# Patient Record
Sex: Male | Born: 1995 | Race: White | Hispanic: No
Health system: Southern US, Community
[De-identification: ages and names within clinical notes are randomized; demographics above are authoritative.]

## PROBLEM LIST (undated history)

## (undated) DIAGNOSIS — Z789 Other specified health status: Secondary | ICD-10-CM

## (undated) HISTORY — PX: WISDOM TOOTH EXTRACTION: SHX21

## (undated) HISTORY — PX: TONSILLECTOMY: SUR1361

## (undated) HISTORY — PX: OTHER SURGICAL HISTORY: SHX169

## (undated) HISTORY — DX: Other specified health status: Z78.9

---
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
## 2016-04-18 ENCOUNTER — Encounter: Payer: Self-pay | Admitting: Family Medicine

## 2016-04-18 ENCOUNTER — Ambulatory Visit (INDEPENDENT_AMBULATORY_CARE_PROVIDER_SITE_OTHER): Payer: 59 | Admitting: Family Medicine

## 2016-04-18 VITALS — BP 134/65 | HR 60 | Temp 98.2°F | Ht 75.0 in | Wt 194.0 lb

## 2016-04-18 DIAGNOSIS — Z114 Encounter for screening for human immunodeficiency virus [HIV]: Secondary | ICD-10-CM

## 2016-04-18 DIAGNOSIS — Z0000 Encounter for general adult medical examination without abnormal findings: Secondary | ICD-10-CM

## 2016-04-18 DIAGNOSIS — R7401 Elevation of levels of liver transaminase levels: Secondary | ICD-10-CM

## 2016-04-18 DIAGNOSIS — R740 Nonspecific elevation of levels of transaminase and lactic acid dehydrogenase [LDH]: Secondary | ICD-10-CM

## 2016-04-18 LAB — COMPREHENSIVE METABOLIC PANEL
ALBUMIN: 5 g/dL (ref 3.5–5.2)
ALT: 73 U/L — AB (ref 0–53)
AST: 255 U/L — AB (ref 0–37)
Alkaline Phosphatase: 54 U/L (ref 39–117)
BILIRUBIN TOTAL: 0.5 mg/dL (ref 0.2–1.2)
BUN: 14 mg/dL (ref 6–23)
CALCIUM: 10.1 mg/dL (ref 8.4–10.5)
CO2: 31 mEq/L (ref 19–32)
CREATININE: 0.95 mg/dL (ref 0.40–1.50)
Chloride: 102 mEq/L (ref 96–112)
GFR: 104.96 mL/min (ref >60.00)
Glucose, Bld: 109 mg/dL — ABNORMAL HIGH (ref 70–99)
Potassium: 3.6 mEq/L (ref 3.5–5.1)
Sodium: 140 mEq/L (ref 135–145)
Total Protein: 7.6 g/dL (ref 6.0–8.3)

## 2016-04-18 LAB — LIPID PANEL
CHOL/HDL RATIO: 3
CHOLESTEROL: 187 mg/dL (ref 0–200)
HDL: 69.2 mg/dL (ref >39.00)
LDL Cholesterol: 109 mg/dL — ABNORMAL HIGH (ref 0–99)
NonHDL: 117.83
TRIGLYCERIDES: 42 mg/dL (ref 0.0–149.0)
VLDL: 8.4 mg/dL (ref 0.0–40.0)

## 2016-04-18 LAB — HIV ANTIBODY (ROUTINE TESTING W REFLEX): HIV 1&2 Ab, 4th Generation: NONREACTIVE

## 2016-04-18 NOTE — Patient Instructions (Signed)
Good luck in the future.

## 2016-04-18 NOTE — Progress Notes (Signed)
Chief Complaint  Patient presents with  . Establish Care    No acute concerns noted.    Well Male Trevor Conley is here for a complete physical.   His last physical was >1 year ago.  Current diet: in general, a "healthy" diet   Current exercise: lifting weights, doing cardio Weight trend: stable Does pt snore? No. Daytime fatigue? No. Seat belt? Yes.    Past Medical History:  Diagnosis Date  . No known health problems     Past Surgical History:  Procedure Laterality Date  . Thumb Surgery    . TONSILLECTOMY    . WISDOM TOOTH EXTRACTION     Medications  Takes no medications routinely.   Allergies No Known Allergies   Family History Family History  Problem Relation Age of Onset  . Diabetes Maternal Grandfather     Review of Systems: Constitutional:  no unexpected change in weight, no fevers or chills Eye:  no recent significant change in vision Ear/Nose/Mouth/Throat:  Ears:  no tinnitus or hearing loss Nose/Mouth/Throat:  no complaints of nasal congestion or bleeding, no sore throat and oral sores Cardiovascular:  no chest pain, no palpitations Respiratory:  no cough and no shortness of breath Gastrointestinal:  no abdominal pain, no change in bowel habits, no nausea, vomiting, diarrhea, or constipation and no black or bloody stool GU:  Male: negative for dysuria, frequency, and incontinence and negative for prostate symptoms Musculoskeletal/Extremities:  no pain, redness, or swelling of the joints Integumentary (Skin/Breast):  no abnormal skin lesions reported Neurologic:  no headaches, no numbness, tingling Endocrine:  weight changes, masses in the neck, heat/cold intolerance, bowel or skin changes, or cardiovascular system symptoms Hematologic/Lymphatic:  no abnormal bleeding, no HIV risk factors, no night sweats, no swollen nodes, no weight loss  Exam BP 134/65 (BP Location: Right Arm, Patient Position: Sitting, Cuff Size: Normal)   Pulse 60   Temp 98.2 F  (36.8 C) (Oral)   Ht 6\' 3"  (1.905 m)   Wt 194 lb (88 kg)   SpO2 100% Comment: RA  BMI 24.25 kg/m  General:  well developed, well nourished, in no apparent distress Skin:  no significant moles, warts, or growths Head:  no masses, lesions, or tenderness Eyes:  pupils equal and round, sclera anicteric without injection Ears:  canals without lesions, TMs shiny without retraction, no obvious effusion, no erythema Nose:  nares patent, septum midline, mucosa normal Throat/Pharynx:  lips and gingiva without lesion; tongue and uvula midline; non-inflamed pharynx; no exudates or postnasal drainage Neck: neck supple without adenopathy, thyromegaly, or masses Lungs:  clear to auscultation, breath sounds equal bilaterally, no respiratory distress Cardio:  regular rate and rhythm without murmurs, heart sounds without clicks or rubs Abdomen:  abdomen soft, nontender; bowel sounds normal; no masses or organomegaly Genital (male): circumcised penis, no lesions or discharge; testes present bilaterally without masses or tenderness Rectal: Deferred Musculoskeletal:  symmetrical muscle groups noted without atrophy or deformity Extremities:  no clubbing, cyanosis, or edema, no deformities, no skin discoloration Neuro:  gait normal; deep tendon reflexes normal and symmetric Psych: well oriented with normal range of affect and appropriate judgment/insight  Assessment and Plan  Well adult exam - Plan: Comprehensive metabolic panel, Lipid panel  Encounter for screening for HIV - Plan: HIV antibody   Well [age] y.o. male. Counseled on diet and exercise. He is doing an excellent job. He got a job in [place] that he will attend after he graduates this spring.  Immunizations, labs, and  further orders as above. F/u pending the above workup. I will likely never see him again unless he has an acute issue as he is moving away. The patient voiced understanding and agreement to the plan.  Jilda Roche [place],  DO 04/18/16 9:07 AM

## 2016-04-18 NOTE — Progress Notes (Signed)
Pre visit review using our clinic review tool, if applicable. No additional management support is needed unless otherwise documented below in the visit note. 

## 2016-04-21 ENCOUNTER — Other Ambulatory Visit: Payer: Self-pay | Admitting: Family Medicine

## 2016-04-21 DIAGNOSIS — R7401 Elevation of levels of liver transaminase levels: Secondary | ICD-10-CM

## 2016-04-21 DIAGNOSIS — R740 Nonspecific elevation of levels of transaminase and lactic acid dehydrogenase [LDH]: Principal | ICD-10-CM

## 2016-04-21 NOTE — Addendum Note (Signed)
Addended by: Kandace BlitzLONG, Desteny Freeman M on: 04/21/2016 02:05 PM   Modules accepted: Orders

## 2017-04-30 ENCOUNTER — Encounter: Payer: 59 | Admitting: Family Medicine

## 2017-05-08 ENCOUNTER — Encounter: Payer: Self-pay | Admitting: Family Medicine

## 2017-05-08 ENCOUNTER — Other Ambulatory Visit (HOSPITAL_COMMUNITY)
Admission: RE | Admit: 2017-05-08 | Discharge: 2017-05-08 | Disposition: A | Discharge status: Home / Self Care | Payer: 59 | Source: Ambulatory Visit | Attending: Family Medicine | Admitting: Family Medicine

## 2017-05-08 ENCOUNTER — Ambulatory Visit (INDEPENDENT_AMBULATORY_CARE_PROVIDER_SITE_OTHER): Payer: 59 | Admitting: Family Medicine

## 2017-05-08 VITALS — BP 122/68 | HR 62 | Temp 98.1°F | Ht 74.0 in | Wt 192.0 lb

## 2017-05-08 DIAGNOSIS — Z23 Encounter for immunization: Secondary | ICD-10-CM | POA: Diagnosis not present

## 2017-05-08 DIAGNOSIS — R5383 Other fatigue: Secondary | ICD-10-CM | POA: Insufficient documentation

## 2017-05-08 DIAGNOSIS — Z0000 Encounter for general adult medical examination without abnormal findings: Secondary | ICD-10-CM | POA: Insufficient documentation

## 2017-05-08 DIAGNOSIS — Z113 Encounter for screening for infections with a predominantly sexual mode of transmission: Secondary | ICD-10-CM | POA: Diagnosis not present

## 2017-05-08 LAB — LIPID PANEL
CHOL/HDL RATIO: 2
CHOLESTEROL: 176 mg/dL (ref 0–200)
HDL: 80.9 mg/dL (ref >39.00)
LDL CALC: 86 mg/dL (ref 0–99)
NonHDL: 94.84
TRIGLYCERIDES: 42 mg/dL (ref 0.0–149.0)
VLDL: 8.4 mg/dL (ref 0.0–40.0)

## 2017-05-08 LAB — COMPREHENSIVE METABOLIC PANEL
ALBUMIN: 4.8 g/dL (ref 3.5–5.2)
ALT: 21 U/L (ref 0–53)
AST: 26 U/L (ref 0–37)
Alkaline Phosphatase: 52 U/L (ref 39–117)
BUN: 17 mg/dL (ref 6–23)
CALCIUM: 9.8 mg/dL (ref 8.4–10.5)
CHLORIDE: 102 meq/L (ref 96–112)
CO2: 28 meq/L (ref 19–32)
CREATININE: 1.1 mg/dL (ref 0.40–1.50)
GFR: 87.8 mL/min (ref >60.00)
Glucose, Bld: 108 mg/dL — ABNORMAL HIGH (ref 70–99)
POTASSIUM: 3.9 meq/L (ref 3.5–5.1)
Sodium: 141 mEq/L (ref 135–145)
Total Bilirubin: 1 mg/dL (ref 0.2–1.2)
Total Protein: 7.3 g/dL (ref 6.0–8.3)

## 2017-05-08 LAB — TSH: TSH: 2.06 u[IU]/mL (ref 0.35–4.50)

## 2017-05-08 NOTE — Progress Notes (Signed)
Pre visit review using our clinic review tool, if applicable. No additional management support is needed unless otherwise documented below in the visit note. 

## 2017-05-08 NOTE — Addendum Note (Signed)
Addended by: Scharlene GlossEWING, Karron Goens B on: 05/08/2017 09:35 AM   Modules accepted: Orders

## 2017-05-08 NOTE — Patient Instructions (Addendum)
Keep up the good work.   We should be able to get the results of your labs to you by the middle of next week.   Let us know if you need anything.

## 2017-05-08 NOTE — Progress Notes (Signed)
Chief Complaint  Patient presents with  . Annual Exam    Well Male Trevor Conley is here for a complete physical.   His last physical was >1 year ago.  Current diet: in general, a "healthy" diet   Current exercise: Lifting 3x/week, cardio 1-2 times Weight trend: stable Does pt snore? No. Daytime fatigue? Slight fatigue, would like thyroid checked.  Seat belt? Yes.    Health maintenance Tetanus- No  Past Medical History:  Diagnosis Date  . No known health problems     Past Surgical History:  Procedure Laterality Date  . Thumb Surgery    . TONSILLECTOMY    . WISDOM TOOTH EXTRACTION     Medications  Takes no meds routinely.  Allergies No Known Allergies   Family History Family History  Problem Relation Age of Onset  . Diabetes Maternal Grandfather     Review of Systems: Constitutional: no fevers or chills Eye:  no recent significant change in vision Ear/Nose/Mouth/Throat:  Ears:  no tinnitus or hearing loss Nose/Mouth/Throat:  no complaints of nasal congestion or bleeding, no sore throat Cardiovascular:  no chest pain, no palpitations Respiratory:  no cough and no shortness of breath Gastrointestinal:  no abdominal pain, no change in bowel habits, no nausea, vomiting, diarrhea, or constipation and no black or bloody stool GU:  Male: negative for dysuria, frequency, and incontinence and negative for prostate symptoms Musculoskeletal/Extremities:  no pain, redness, or swelling of the joints Integumentary (Skin/Breast):  no abnormal skin lesions reported Neurologic:  no headaches, no numbness, tingling Endocrine: No unexpected weight changes Hematologic/Lymphatic:  no night sweats  Exam BP 122/68 (BP Location: Left Arm, Patient Position: Sitting, Cuff Size: Normal)   Pulse 62   Temp 98.1 F (36.7 C) (Oral)   Ht 6\' 2"  (1.88 m)   Wt 192 lb (87.1 kg)   SpO2 97%   BMI 24.65 kg/m  General:  well developed, well nourished, in no apparent distress Skin:  no  significant moles, warts, or growths Head:  no masses, lesions, or tenderness Eyes:  pupils equal and round, sclera anicteric without injection Ears:  canals without lesions, TMs shiny without retraction, no obvious effusion, no erythema Nose:  nares patent, septum midline, mucosa normal Throat/Pharynx:  lips and gingiva without lesion; tongue and uvula midline; non-inflamed pharynx; no exudates or postnasal drainage Neck: neck supple without adenopathy, thyromegaly, or masses Lungs:  clear to auscultation, breath sounds equal bilaterally, no respiratory distress Cardio:  regular rate and rhythm without murmurs, heart sounds without clicks or rubs Abdomen:  abdomen soft, nontender; bowel sounds normal; no masses or organomegaly Genital (male): circumcised penis, no lesions or discharge; testes present bilaterally without masses or tenderness Rectal: Deferred Musculoskeletal:  symmetrical muscle groups noted without atrophy or deformity Extremities:  no clubbing, cyanosis, or edema, no deformities, no skin discoloration Neuro:  gait normal; deep tendon reflexes normal and symmetric Psych: well oriented with normal range of affect and appropriate judgment/insight  Assessment and Plan  Well adult exam - Plan: Comprehensive metabolic panel, Lipid panel  Other fatigue - Plan: TSH  Routine screening for STI (sexually transmitted infection) - Plan: Urine cytology ancillary only, HIV antibody (with reflex)   Well 22 y.o. male. Counseled on diet and exercise. Ck STI's at request of pt.  TSH for fatigue.  Other orders as above. Follow up in 1 year pending the above workup. The patient voiced understanding and agreement to the plan.  Jilda Rocheicholas [place], DO 05/08/17 9:29 AM

## 2017-05-09 LAB — HIV ANTIBODY (ROUTINE TESTING W REFLEX): HIV: NONREACTIVE

## 2017-05-11 LAB — URINE CYTOLOGY ANCILLARY ONLY
CHLAMYDIA, DNA PROBE: NEGATIVE
NEISSERIA GONORRHEA: NEGATIVE
Trichomonas: NEGATIVE

## 2017-05-28 ENCOUNTER — Other Ambulatory Visit: Payer: Self-pay

## 2017-05-28 ENCOUNTER — Encounter (HOSPITAL_BASED_OUTPATIENT_CLINIC_OR_DEPARTMENT_OTHER): Payer: Self-pay | Admitting: Emergency Medicine

## 2017-05-28 ENCOUNTER — Emergency Department (HOSPITAL_BASED_OUTPATIENT_CLINIC_OR_DEPARTMENT_OTHER): Payer: 59

## 2017-05-28 ENCOUNTER — Emergency Department (HOSPITAL_BASED_OUTPATIENT_CLINIC_OR_DEPARTMENT_OTHER)
Admission: EM | Admit: 2017-05-28 | Discharge: 2017-05-29 | Disposition: A | Discharge status: Home / Self Care | Payer: 59 | Attending: Emergency Medicine | Admitting: Emergency Medicine

## 2017-05-28 DIAGNOSIS — F430 Acute stress reaction: Secondary | ICD-10-CM | POA: Insufficient documentation

## 2017-05-28 DIAGNOSIS — R079 Chest pain, unspecified: Secondary | ICD-10-CM | POA: Diagnosis present

## 2017-05-28 NOTE — ED Triage Notes (Signed)
Patient states that he has had pain to his chest x a month intermittently. The patient reports that he has some chest tightness with the pain  - Family was concerned because his HR was low at home and his BP was low. PAtient has normal BP

## 2017-05-29 ENCOUNTER — Encounter (HOSPITAL_BASED_OUTPATIENT_CLINIC_OR_DEPARTMENT_OTHER): Payer: Self-pay | Admitting: Emergency Medicine

## 2017-05-29 LAB — BASIC METABOLIC PANEL
ANION GAP: 10 (ref 5–15)
BUN: 23 mg/dL — ABNORMAL HIGH (ref 6–20)
CO2: 24 mmol/L (ref 22–32)
Calcium: 9.5 mg/dL (ref 8.9–10.3)
Chloride: 103 mmol/L (ref 101–111)
Creatinine, Ser: 1.07 mg/dL (ref 0.61–1.24)
GFR calc Af Amer: >60 mL/min (ref >60)
GLUCOSE: 100 mg/dL — AB (ref 65–99)
POTASSIUM: 3.5 mmol/L (ref 3.5–5.1)
Sodium: 137 mmol/L (ref 135–145)

## 2017-05-29 LAB — CBC
HEMATOCRIT: 46 % (ref 39.0–52.0)
HEMOGLOBIN: 16 g/dL (ref 13.0–17.0)
MCH: 31.3 pg (ref 26.0–34.0)
MCHC: 34.8 g/dL (ref 30.0–36.0)
MCV: 90 fL (ref 78.0–100.0)
Platelets: 151 10*3/uL (ref 150–400)
RBC: 5.11 MIL/uL (ref 4.22–5.81)
RDW: 12.1 % (ref 11.5–15.5)
WBC: 7.3 10*3/uL (ref 4.0–10.5)

## 2017-05-29 LAB — TROPONIN I: Troponin I: <0.03 ng/mL (ref <0.03)

## 2017-05-29 NOTE — ED Notes (Signed)
Pt. Reports he has had anxiety lately and aware of his anxiety issues.  RN spoke with Pt and did teaching with Pt. About speaking with someone about his anxiety.  Pt. Parents at bedside.

## 2017-05-29 NOTE — ED Notes (Signed)
Report given to Katie RN.

## 2017-05-29 NOTE — ED Provider Notes (Signed)
[facility] EMERGENCY DEPARTMENT Provider Note   CSN: [id number] Arrival date & time: 05/28/17  2105     History   Chief Complaint Chief Complaint  Patient presents with  . Chest Pain    HPI Trevor Conley is a 22 y.o. male.  The history is provided by the patient. No language interpreter was used.  Chest Pain   This is a recurrent problem. The current episode started 3 to 5 hours ago. The problem occurs constantly. The problem has not changed since onset.The pain is associated with an emotional upset (when under stress, tonight was at a funeral). The pain is present in the substernal region. The pain is moderate. The quality of the pain is described as dull. The pain does not radiate. Exacerbated by: upset. Pertinent negatives include no abdominal pain, no back pain, no claudication, no cough, no diaphoresis, no dizziness, no exertional chest pressure, no fever, no headaches, no hemoptysis, no irregular heartbeat, no leg pain, no lower extremity edema, no malaise/fatigue, no nausea, no near-syncope, no numbness, no orthopnea, no palpitations, no PND, no shortness of breath, no sputum production, no syncope, no vomiting and no weakness. He has tried nothing for the symptoms. The treatment provided no relief. Risk factors include male gender.  Pertinent negatives for past medical history include no valve disorder.  Pertinent negatives for family medical history include: no Marfan's syndrome.  Procedure history is negative for cardiac catheterization.    Past Medical History:  Diagnosis Date  . No known health problems     There are no active problems to display for this patient.   Past Surgical History:  Procedure Laterality Date  . Thumb Surgery    . TONSILLECTOMY    . WISDOM TOOTH EXTRACTION         Home Medications    Prior to Admission medications   Not on File    Family History Family History  Problem Relation Age of Onset  . Diabetes Maternal  Grandfather     Social History Social History   Tobacco Use  . Smoking status: Never Smoker  . Smokeless tobacco: Never Used  Substance Use Topics  . Alcohol use: Yes    Comment: Social  . Drug use: No     Allergies   Patient has no known allergies.   Review of Systems Review of Systems  Constitutional: Negative for diaphoresis, fever and malaise/fatigue.  Respiratory: Negative for cough, hemoptysis, sputum production and shortness of breath.   Cardiovascular: Positive for chest pain. Negative for palpitations, orthopnea, claudication, leg swelling, syncope, PND and near-syncope.  Gastrointestinal: Negative for abdominal pain, nausea and vomiting.  Musculoskeletal: Negative for back pain.  Neurological: Negative for dizziness, weakness, numbness and headaches.  All other systems reviewed and are negative.    Physical Exam Updated Vital Signs BP (!) 106/53   Pulse 63   Temp 98.1 F (36.7 C) (Oral)   Resp 18   Ht 6\' 2"  (1.88 m)   Wt 87.1 kg (192 lb)   SpO2 96%   BMI 24.65 kg/m   Physical Exam  Constitutional: He is oriented to person, place, and time. He appears well-developed and well-nourished. No distress.  HENT:  Head: Normocephalic and atraumatic.  Mouth/Throat: No oropharyngeal exudate.  Eyes: Conjunctivae are normal. Pupils are equal, round, and reactive to light.  Neck: Normal range of motion. Neck supple.  Cardiovascular: Normal rate, regular rhythm, normal heart sounds and intact distal pulses.  Pulmonary/Chest: Effort normal and breath sounds normal.  No stridor. He has no wheezes. He has no rales.  Abdominal: Soft. Bowel sounds are normal. He exhibits no mass. There is no tenderness. There is no rebound and no guarding.  Musculoskeletal: Normal range of motion. He exhibits no edema.  Neurological: He is alert and oriented to person, place, and time. He displays normal reflexes. He exhibits normal muscle tone. Coordination normal.  Skin: Skin is warm  and dry. Capillary refill takes less than 2 seconds.     ED Treatments / Results  Labs (all labs ordered are listed, but only abnormal results are displayed) Results for orders placed or performed during the hospital encounter of 05/28/17  Basic metabolic panel  Result Value Ref Range   Sodium 137 135 - 145 mmol/L   Potassium 3.5 3.5 - 5.1 mmol/L   Chloride 103 101 - 111 mmol/L   CO2 24 22 - 32 mmol/L   Glucose, Bld 100 (H) 65 - 99 mg/dL   BUN 23 (H) 6 - 20 mg/dL   Creatinine, Ser 5.40 0.61 - 1.24 mg/dL   Calcium 9.5 8.9 - 98.1 mg/dL   GFR calc non Af Amer >60 >60 mL/min   GFR calc Af Amer >60 >60 mL/min   Anion gap 10 5 - 15  CBC  Result Value Ref Range   WBC 7.3 4.0 - 10.5 K/uL   RBC 5.11 4.22 - 5.81 MIL/uL   Hemoglobin 16.0 13.0 - 17.0 g/dL   HCT 19.1 47.8 - 29.5 %   MCV 90.0 78.0 - 100.0 fL   MCH 31.3 26.0 - 34.0 pg   MCHC 34.8 30.0 - 36.0 g/dL   RDW 62.1 30.8 - 65.7 %   Platelets 151 150 - 400 K/uL  Troponin I  Result Value Ref Range   Troponin I <0.03 <0.03 ng/mL   Dg Chest 2 View  Result Date: 05/28/2017 CLINICAL DATA:  Chest pain EXAM: CHEST  2 VIEW COMPARISON:  08/04/2010 FINDINGS: The heart size and mediastinal contours are within normal limits. Both lungs are clear. The visualized skeletal structures are unremarkable. IMPRESSION: No active cardiopulmonary disease. Electronically Signed   By: Jasmine Pang M.D.   On: 05/28/2017 22:05    EKG  EKG Interpretation  Date/Time:  Thursday May 28 2017 21:08:15 EST Ventricular Rate:  52 PR Interval:  160 QRS Duration: 96 QT Interval:  392 QTC Calculation: 364 R Axis:   96 Text Interpretation:  Sinus bradycardia with sinus arrhythmia Rightward axis Nonspecific ST abnormality Abnormal ECG No old tracing to compare Confirmed by Rolan Bucco [phone]) on 05/28/2017 11:05:41 PM       Radiology Dg Chest 2 View  Result Date: 05/28/2017 CLINICAL DATA:  Chest pain EXAM: CHEST  2 VIEW COMPARISON:  08/04/2010  FINDINGS: The heart size and mediastinal contours are within normal limits. Both lungs are clear. The visualized skeletal structures are unremarkable. IMPRESSION: No active cardiopulmonary disease. Electronically Signed   By: Jasmine Pang M.D.   On: 05/28/2017 22:05    Procedures Procedures (including critical care time)  PERC negative wells ) highly doubt PE.  I do not believethe is ACS.  This is clearly anxiety HEART score 0 low risk for MACE  Final Clinical Impressions(s) / ED Diagnoses   Final diagnoses:  Stress reaction    Return for weakness, numbness, changes in vision or speech,  fevers > 100.4 unrelieved by medication, shortness of breath, intractable vomiting, or diarrhea, abdominal pain, Inability to tolerate liquids or food, cough, altered mental status or  any concerns. No signs of systemic illness or infection. The patient is nontoxic-appearing on exam and vital signs are within normal limits.    I have reviewed the triage vital signs and the nursing notes. Pertinent labs &imaging results that were available during my care of the patient were reviewed by me and considered in my medical decision making (see chart for details).  After history, exam, and medical workup I feel the patient has been appropriately medically screened and is safe for discharge home. Pertinent diagnoses were discussed with the patient. Patient was given return precautions.      Florina Glas, MD 05/29/17 [phone]

## 2017-06-01 ENCOUNTER — Ambulatory Visit: Payer: 59 | Admitting: Family Medicine

## 2017-06-01 ENCOUNTER — Encounter: Payer: Self-pay | Admitting: Family Medicine

## 2017-06-01 VITALS — BP 118/80 | HR 60 | Temp 98.4°F | Ht 74.0 in | Wt 190.5 lb

## 2017-06-01 DIAGNOSIS — F419 Anxiety disorder, unspecified: Secondary | ICD-10-CM

## 2017-06-01 DIAGNOSIS — F410 Panic disorder [episodic paroxysmal anxiety] without agoraphobia: Secondary | ICD-10-CM | POA: Diagnosis not present

## 2017-06-01 NOTE — Progress Notes (Signed)
Pre visit review using our clinic review tool, if applicable. No additional management support is needed unless otherwise documented below in the visit note. 

## 2017-06-01 NOTE — Patient Instructions (Signed)
Please consider counseling. Contact [phone] to schedule an appointment or inquire about cost/insurance coverage.  Coping skills Choose 5 that work for you:  Take a deep breath  Count to 20  Read a book  Do a puzzle  Meditate  Bake  Sing  Knit  Garden  Pray  Go outside  Call a friend  Listen to music  Take a walk  Color  Send a note  Take a bath  Watch a movie  Be alone in a quiet place  Pet an animal  Visit a friend  Journal  Exercise  Stretch   There are medicines we can start also. Send me a MyChart message if you are interested in starting either a daily medicine (sertraline/Zoloft) or a medicine you can take as needed for panic attacks (hydroxyzine/atarax).

## 2017-06-01 NOTE — Progress Notes (Signed)
Chief Complaint  Patient presents with  . Hospitalization Follow-up    panic attacks    Subjective Trevor Conley is an 22 y.o. male who presents with anxiety/depression Symptoms began 2 mo ago. Anxiety symptoms: difficulty concentrating, fatigue, irritable, palpitations, panic attacks. Family history significant for anxiety and bipolar. Social stressors include  job finding issues. He is not currently, nor has he ever been on medication for this. He is not following with a psychologist.  Past Medical History:  Diagnosis Date  . No known health problems     Review Of Systems Psychiatric: as noted in HPI  Exam BP 118/80 (BP Location: Left Arm, Patient Position: Sitting, Cuff Size: Large)   Pulse 60   Temp 98.4 F (36.9 C) (Oral)   Ht 6\' 2"  (1.88 m)   Wt 190 lb 8 oz (86.4 kg)   SpO2 97%   BMI 24.46 kg/m  General:  well developed, well nourished, in no apparent distress Lungs:  clear to auscultation, breath sounds equal bilaterally, normal respiratory effort without accessory muscle use Cardio:  regular rate and rhythm without murmurs Abdomen:  abdomen soft, nontender; bowel sounds normal; no masses or organomegaly Neuro:  deep tendon reflexes normal and symmetric and no cerebellar signs or ataxia noted Psych: well oriented with normal range of affect and age-appropriate judgement/insight  Assessment and Plan  Anxiety  Panic attacks  Discussed options of medication, counseling, watchful waiting. He would like to see the counseling team.  Send mychart message if interested in medical management. Will f/u in 6 weeks after starting a med.  Counseled on adjunctive treatment with exercise/physical activity. Number for [facility] provided in AVS. F/u prn. Patient voiced understanding and agreement to the plan.  Jilda Rocheicholas Paul [place], DO 06/01/17 12:10 PM

## 2018-09-15 ENCOUNTER — Ambulatory Visit (INDEPENDENT_AMBULATORY_CARE_PROVIDER_SITE_OTHER): Payer: 59 | Admitting: Family Medicine

## 2018-09-15 ENCOUNTER — Encounter: Payer: Self-pay | Admitting: Family Medicine

## 2018-09-15 ENCOUNTER — Telehealth: Payer: Self-pay | Admitting: Family Medicine

## 2018-09-15 DIAGNOSIS — R5383 Other fatigue: Secondary | ICD-10-CM

## 2018-09-15 NOTE — Telephone Encounter (Signed)
Copied from [place] [phone] [phone]. Topic: General - Other >> Sep 15, 2018  9:07 AM Carolyn Stare wrote:  Pt Mom call to req an appt said pt is having some fatigue for the past couple weeks  Called and scheduled Virtual Visit today at 4

## 2018-09-15 NOTE — Progress Notes (Signed)
Chief Complaint  Patient presents with  . Fatigue    for several weeks    Subjective: Patient is a 23 y.o. male here for fatigue. Due to COVID-19 pandemic, we are interacting via web portal for an electronic face-to-face visit. I verified patient's ID using 2 identifiers. Patient agreed to proceed with visit via this method. Patient is at home, I am at office. Patient and I are present for visit.    +fatigue for past 2 weeks. No change in diet or PO intake of water. Has been less active due to quarantine. Feels down more because of quarantine. No wt changes, does not take any medications routinely. Has been snoring over past week, no famhx of OSA. He does not wake up gasping for air. Does not wake up feeling refreshed.   ROS: Const: No wt loss  Past Medical History:  Diagnosis Date  . No known health problems     Objective: No conversational dyspnea Age appropriate judgment and insight Nml affect and mood  Assessment and Plan: Fatigue, unspecified type - Plan: Comprehensive metabolic panel, CBC, TSH, T4, free, Vitamin D (25 hydroxy)  Orders as above. Could be combo of less physical activity and situational depression. Educated pt that OSA is also a distant consideration and we may not come to a satisfactory answer.  The patient voiced understanding and agreement to the plan.  [place], DO 09/15/18  4:28 PM

## 2018-09-21 ENCOUNTER — Other Ambulatory Visit: Payer: Self-pay

## 2018-09-21 ENCOUNTER — Other Ambulatory Visit (INDEPENDENT_AMBULATORY_CARE_PROVIDER_SITE_OTHER): Payer: BC Managed Care – PPO

## 2018-09-21 DIAGNOSIS — R5383 Other fatigue: Secondary | ICD-10-CM

## 2018-09-22 ENCOUNTER — Encounter: Payer: Self-pay | Admitting: Family Medicine

## 2018-09-22 LAB — COMPREHENSIVE METABOLIC PANEL
ALT: 28 U/L (ref 0–53)
AST: 29 U/L (ref 0–37)
Albumin: 5.1 g/dL (ref 3.5–5.2)
Alkaline Phosphatase: 65 U/L (ref 39–117)
BUN: 14 mg/dL (ref 6–23)
CO2: 27 mEq/L (ref 19–32)
Calcium: 9.9 mg/dL (ref 8.4–10.5)
Chloride: 103 mEq/L (ref 96–112)
Creatinine, Ser: 0.91 mg/dL (ref 0.40–1.50)
GFR: 101.64 mL/min (ref >60.00)
Glucose, Bld: 81 mg/dL (ref 70–99)
Potassium: 4.1 mEq/L (ref 3.5–5.1)
Sodium: 141 mEq/L (ref 135–145)
Total Bilirubin: 0.8 mg/dL (ref 0.2–1.2)
Total Protein: 7.2 g/dL (ref 6.0–8.3)

## 2018-09-22 LAB — CBC
HCT: 45.3 % (ref 39.0–52.0)
Hemoglobin: 15.6 g/dL (ref 13.0–17.0)
MCHC: 34.6 g/dL (ref 30.0–36.0)
MCV: 92.5 fl (ref 78.0–100.0)
Platelets: 137 10*3/uL — ABNORMAL LOW (ref 150.0–400.0)
RBC: 4.89 Mil/uL (ref 4.22–5.81)
RDW: 12.5 % (ref 11.5–15.5)
WBC: 7.2 10*3/uL (ref 4.0–10.5)

## 2018-09-22 LAB — TSH: TSH: 2.41 u[IU]/mL (ref 0.35–4.50)

## 2018-09-22 LAB — VITAMIN D 25 HYDROXY (VIT D DEFICIENCY, FRACTURES): VITD: 72.62 ng/mL (ref 30.00–100.00)

## 2018-09-22 LAB — T4, FREE: Free T4: 1.23 ng/dL (ref 0.60–1.60)

## 2019-04-15 ENCOUNTER — Encounter: Payer: Self-pay | Admitting: Family Medicine

## 2019-04-15 ENCOUNTER — Ambulatory Visit (INDEPENDENT_AMBULATORY_CARE_PROVIDER_SITE_OTHER): Payer: BLUE CROSS/BLUE SHIELD | Admitting: Family Medicine

## 2019-04-15 ENCOUNTER — Other Ambulatory Visit: Payer: Self-pay

## 2019-04-15 VITALS — Temp 96.0°F | Ht 74.0 in | Wt 194.0 lb

## 2019-04-15 DIAGNOSIS — U071 COVID-19: Secondary | ICD-10-CM

## 2019-04-15 MED ORDER — PREDNISONE 20 MG PO TABS: 40.0000 mg | ORAL_TABLET | Freq: Every day | ORAL | 0 refills | Status: AC

## 2019-04-15 NOTE — Progress Notes (Signed)
Chief Complaint  Patient presents with  . jointsaches, cold feet/hands, sweating, fatifuge    COVID in November    Subjective: Patient is a 24 y.o. male here for f/u. Due to COVID-19 pandemic, we are interacting via web portal for an electronic face-to-face visit. I verified patient's ID using 2 identifiers. Patient agreed to proceed with visit via this method. Patient is at home, I am at office. Patient and I are present for visit.   Dx'd w COVID in Nov. Still having fatigue, intermittent sob, and achiness. It waxes and wanes but does not seem to be improving. Has tried Tylenol at points which helps temporarily. No cough, wheezing, URI s/s's, fevers, N/V. No new exposure to sick contacts. Tried running and it took him around 1 week to recover.   ROS: Endo: No wt loss Const: No fevers  Past Medical History:  Diagnosis Date  . No known health problems     Objective: Temp (!) 96 F (35.6 C) (Temporal)   Ht 6\' 2"  (1.88 m)   Wt 194 lb (88 kg)   BMI 24.91 kg/m  No conversational dyspnea Age appropriate judgment and insight Nml affect and mood  Assessment and Plan: COVID-19 - Plan: predniSONE (DELTASONE) 20 MG tablet  Push fluids. Yoga/stretching, ease back into workouts. Could try pred burst if these do not work. Heat. Tylenol prn. This could last several more weeks, cont supportive care. Let know if things change. The patient voiced understanding and agreement to the plan.  [place], DO 04/15/19  4:19 PM

## 2019-04-26 ENCOUNTER — Encounter: Payer: Self-pay | Admitting: Family Medicine

## 2019-04-29 ENCOUNTER — Ambulatory Visit (INDEPENDENT_AMBULATORY_CARE_PROVIDER_SITE_OTHER): Payer: BLUE CROSS/BLUE SHIELD | Admitting: Family Medicine

## 2019-04-29 ENCOUNTER — Encounter: Payer: Self-pay | Admitting: Family Medicine

## 2019-04-29 ENCOUNTER — Other Ambulatory Visit: Payer: Self-pay

## 2019-04-29 VITALS — BP 120/80 | HR 73 | Temp 97.7°F | Ht 74.0 in | Wt 194.0 lb

## 2019-04-29 DIAGNOSIS — E559 Vitamin D deficiency, unspecified: Secondary | ICD-10-CM | POA: Diagnosis not present

## 2019-04-29 DIAGNOSIS — U071 COVID-19: Secondary | ICD-10-CM

## 2019-04-29 DIAGNOSIS — R5383 Other fatigue: Secondary | ICD-10-CM

## 2019-04-29 LAB — COMPREHENSIVE METABOLIC PANEL
ALT: 23 U/L (ref 0–53)
AST: 19 U/L (ref 0–37)
Albumin: 5 g/dL (ref 3.5–5.2)
Alkaline Phosphatase: 59 U/L (ref 39–117)
BUN: 13 mg/dL (ref 6–23)
CO2: 28 mEq/L (ref 19–32)
Calcium: 10.1 mg/dL (ref 8.4–10.5)
Chloride: 103 mEq/L (ref 96–112)
Creatinine, Ser: 1.08 mg/dL (ref 0.40–1.50)
GFR: 83.01 mL/min (ref >60.00)
Glucose, Bld: 82 mg/dL (ref 70–99)
Potassium: 4 mEq/L (ref 3.5–5.1)
Sodium: 141 mEq/L (ref 135–145)
Total Bilirubin: 0.8 mg/dL (ref 0.2–1.2)
Total Protein: 7.2 g/dL (ref 6.0–8.3)

## 2019-04-29 LAB — CBC
HCT: 46.6 % (ref 39.0–52.0)
Hemoglobin: 16 g/dL (ref 13.0–17.0)
MCHC: 34.3 g/dL (ref 30.0–36.0)
MCV: 92.1 fl (ref 78.0–100.0)
Platelets: 140 10*3/uL — ABNORMAL LOW (ref 150.0–400.0)
RBC: 5.06 Mil/uL (ref 4.22–5.81)
RDW: 12.7 % (ref 11.5–15.5)
WBC: 3.5 10*3/uL — ABNORMAL LOW (ref 4.0–10.5)

## 2019-04-29 LAB — TSH: TSH: 2.08 u[IU]/mL (ref 0.35–4.50)

## 2019-04-29 LAB — T4, FREE: Free T4: 1.19 ng/dL (ref 0.60–1.60)

## 2019-04-29 LAB — VITAMIN D 25 HYDROXY (VIT D DEFICIENCY, FRACTURES): VITD: 60.33 ng/mL (ref 30.00–100.00)

## 2019-04-29 NOTE — Patient Instructions (Signed)
Give [place] 2-3 business days to get the results of your labs back.   Keep the diet clean and stay active.  Coffee is OK.  Take a Vit B12 supplement or B complex vitamin.   If labs are normal, I will send in a medicine to see how things are going.  Let us know if you need anything.

## 2019-04-29 NOTE — Progress Notes (Signed)
Chief Complaint  Patient presents with  . Follow-up    Lingering COVID symptoms    Subjective: Patient is a 24 y.o. male here for f/u.  Patient was diagnosed with Covid in November.  He continues to have fatigue, intermittent shortness of breath, and achiness.  He is having hip pain and shin pain most notably.  He is taking vitamin D daily.  He is wondering what the next steps are.  He denies any fevers.  He is eating healthy.  He is trying to exercise, however notes of a specific workout exhaustion, he will feel out of commission for the next 2 days.   ROS: Const: No fevers Endo: +fatigue  Past Medical History:  Diagnosis Date  . No known health problems     Objective: BP 120/80 (BP Location: Left Arm, Patient Position: Sitting, Cuff Size: Normal)   Pulse 73   Temp 97.7 F (36.5 C) (Temporal)   Ht 6\' 2"  (1.88 m)   Wt 194 lb (88 kg)   SpO2 96%   BMI 24.91 kg/m  General: Awake, appears stated age HEENT: MMM, EOMi Heart: RRR, no LE edema Lungs: CTAB, no rales, wheezes or rhonchi. No accessory muscle use MSK: +ttp over tibia b/l Neuro: DTR's equal and symmetric, no cerebellar signs, gait nml Psych: Age appropriate judgment and insight, normal affect and mood  Assessment and Plan: COVID-19  Vitamin D insufficiency - Plan: VITAMIN D 25 Hydroxy (Vit-D Deficiency, Fractures)  Other fatigue - Plan: CBC, Comprehensive metabolic panel, TSH, T4, free  Check labs, if no improvement, will consider a central neuromodulator for possible chronic fatigue syndrome treatment.  I did explain to him that there is no data regarding this but he is willing to try given his frustration with his continuing symptoms. The patient voiced understanding and agreement to the plan.  [place], DO 04/29/19  12:17 PM

## 2019-04-30 ENCOUNTER — Other Ambulatory Visit: Payer: Self-pay | Admitting: Family Medicine

## 2019-04-30 ENCOUNTER — Encounter: Payer: Self-pay | Admitting: Family Medicine

## 2019-04-30 MED ORDER — NORTRIPTYLINE HCL 25 MG PO CAPS: 25.0000 mg | ORAL_CAPSULE | Freq: Every day | ORAL | 3 refills | Status: AC

## 2019-06-28 ENCOUNTER — Other Ambulatory Visit: Payer: Self-pay | Admitting: Internal Medicine

## 2019-06-28 ENCOUNTER — Other Ambulatory Visit (HOSPITAL_COMMUNITY): Payer: Self-pay | Admitting: Internal Medicine

## 2019-06-28 DIAGNOSIS — G9349 Other encephalopathy: Secondary | ICD-10-CM

## 2019-06-28 DIAGNOSIS — H539 Unspecified visual disturbance: Secondary | ICD-10-CM

## 2019-07-08 ENCOUNTER — Other Ambulatory Visit: Payer: Self-pay

## 2019-07-08 ENCOUNTER — Ambulatory Visit (HOSPITAL_COMMUNITY)
Admission: RE | Admit: 2019-07-08 | Discharge: 2019-07-08 | Disposition: A | Discharge status: Home / Self Care | Payer: BLUE CROSS/BLUE SHIELD | Source: Ambulatory Visit | Attending: Internal Medicine | Admitting: Internal Medicine

## 2019-07-08 DIAGNOSIS — G9349 Other encephalopathy: Secondary | ICD-10-CM | POA: Insufficient documentation

## 2019-07-08 DIAGNOSIS — H539 Unspecified visual disturbance: Secondary | ICD-10-CM | POA: Insufficient documentation

## 2019-07-08 MED ORDER — GADOBUTROL 1 MMOL/ML IV SOLN
10.0000 mL | Freq: Once | INTRAVENOUS | Status: AC | PRN
  Administered 2019-07-08: 10 mL via INTRAVENOUS

## 2019-07-21 ENCOUNTER — Other Ambulatory Visit: Payer: Self-pay | Admitting: Internal Medicine

## 2019-07-21 ENCOUNTER — Ambulatory Visit (HOSPITAL_BASED_OUTPATIENT_CLINIC_OR_DEPARTMENT_OTHER): Payer: BLUE CROSS/BLUE SHIELD

## 2019-07-21 ENCOUNTER — Other Ambulatory Visit (HOSPITAL_BASED_OUTPATIENT_CLINIC_OR_DEPARTMENT_OTHER): Payer: Self-pay | Admitting: Internal Medicine

## 2019-07-21 ENCOUNTER — Ambulatory Visit
Admission: RE | Admit: 2019-07-21 | Discharge: 2019-07-21 | Disposition: A | Discharge status: Home / Self Care | Payer: BLUE CROSS/BLUE SHIELD | Source: Ambulatory Visit | Attending: Internal Medicine | Admitting: Internal Medicine

## 2019-07-21 ENCOUNTER — Other Ambulatory Visit: Payer: Self-pay

## 2019-07-21 DIAGNOSIS — R590 Localized enlarged lymph nodes: Secondary | ICD-10-CM | POA: Diagnosis not present

## 2019-07-21 MED ORDER — IOHEXOL 300 MG/ML  SOLN
75.0000 mL | Freq: Once | INTRAMUSCULAR | Status: AC | PRN
  Administered 2019-07-21: 11:00:00 75 mL via INTRAVENOUS

## 2019-07-22 ENCOUNTER — Ambulatory Visit (HOSPITAL_BASED_OUTPATIENT_CLINIC_OR_DEPARTMENT_OTHER): Payer: BLUE CROSS/BLUE SHIELD

## 2019-07-22 ENCOUNTER — Encounter (HOSPITAL_BASED_OUTPATIENT_CLINIC_OR_DEPARTMENT_OTHER): Payer: Self-pay

## 2019-07-25 ENCOUNTER — Other Ambulatory Visit: Payer: Self-pay | Admitting: Otolaryngology

## 2019-07-25 DIAGNOSIS — R590 Localized enlarged lymph nodes: Secondary | ICD-10-CM

## 2019-07-29 ENCOUNTER — Other Ambulatory Visit: Payer: Self-pay | Admitting: Otolaryngology

## 2019-07-29 ENCOUNTER — Other Ambulatory Visit: Payer: Self-pay

## 2019-07-29 ENCOUNTER — Ambulatory Visit
Admission: RE | Admit: 2019-07-29 | Discharge: 2019-07-29 | Disposition: A | Discharge status: Home / Self Care | Payer: BLUE CROSS/BLUE SHIELD | Source: Ambulatory Visit | Attending: Otolaryngology | Admitting: Otolaryngology

## 2019-07-29 DIAGNOSIS — R590 Localized enlarged lymph nodes: Secondary | ICD-10-CM | POA: Diagnosis not present

## 2019-07-29 NOTE — Discharge Instructions (Signed)
Needle Biopsy, Care After This sheet gives you information about how to care for yourself after your procedure. Your health care provider may also give you more specific instructions. If you have problems or questions, contact your health care provider. What can I expect after the procedure? After the procedure, it is common to have soreness, bruising, or mild pain at the puncture site. This should go away in a few days. Follow these instructions at home: Needle insertion site care   Wash your hands with soap and water before you change your bandage (dressing). If you cannot use soap and water, use hand sanitizer.  Follow instructions from your health care provider about how to take care of your puncture site. This includes: ? When and how to change your dressing. ? When to remove your dressing.  Check your puncture site every day for signs of infection. Check for: ? Redness, swelling, or pain. ? Fluid or blood. ? Pus or a bad smell. ? Warmth. General instructions  Return to your normal activities as told by your health care provider. Ask your health care provider what activities are safe for you.  Do not take baths, swim, or use a hot tub until your health care provider approves. Ask your health care provider if you may take showers. You may only be allowed to take sponge baths.  Take over-the-counter and prescription medicines only as told by your health care provider.  Keep all follow-up visits as told by your health care provider. This is important. Contact a health care provider if:  You have a fever.  You have redness, swelling, or pain at the puncture site that lasts longer than a few days.  You have fluid, blood, or pus coming from your puncture site.  Your puncture site feels warm to the touch. Get help right away if:  You have severe bleeding from the puncture site. Summary  After the procedure, it is common to have soreness, bruising, or mild pain at the puncture  site. This should go away in a few days.  Check your puncture site every day for signs of infection, such as redness, swelling, or pain.  Get help right away if you have severe bleeding from your puncture site. This information is not intended to replace advice given to you by your health care provider. Make sure you discuss any questions you have with your health care provider. Document Revised: 06/05/2017 Document Reviewed: 04/06/2017 [employer] Patient Education  2020 [employer]  

## 2019-07-29 NOTE — Progress Notes (Signed)
Patient ID: Trevor Conley, male   DOB: February 05, 1996, 24 y.o.   MRN: [id number] Patient presents to IR department today for image guided left supraclavicular lymph node biopsy.  He has a history of left cervical/supraclavicular as well as mild bilateral inguinal adenopathy of several weeks duration.  Nontender to palpation.  Patient has a history of Covid-19 infection in November last year.  He has not been vaccinated. There is a family history of Hodgkin's lymphoma.  Details/risks of procedure, including but not limited to, internal bleeding, infection, injury to adjacent structures discussed with patient with his understanding and consent.  He does not wish to receive IV conscious sedation for the procedure.

## 2021-07-31 IMAGING — MR MR HEAD WO/W CM
14 of 15 series · 41 of 48 positions shown · IV contrast (gadavist)
Comparison: None.

CLINICAL DATA: Previous coronavirus infection. Blurred vision and
headaches.

EXAM:
MRI HEAD WITHOUT AND WITH CONTRAST
TECHNIQUE: Multiplanar, multiecho pulse sequences of the brain and surrounding
structures were obtained without and with intravenous contrast.
CONTRAST:  10mL GADAVIST GADOBUTROL 1 MMOL/ML IV SOLN

[Series 5: T1 · sagittal · 5.0mm · 0.75mm/px · 2 of 26 slices shown (1 of 2)]
[im 1/26]
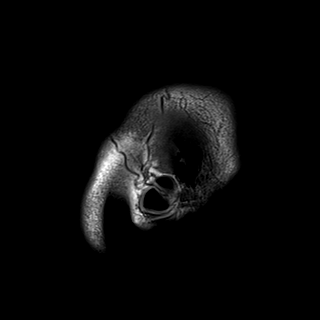
[im 26/26]
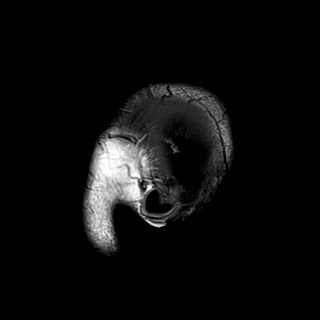

[Series 6: T2 · axial · 5.0mm · 0.62mm/px · 1 of 26 slices shown]
[im 1/26]
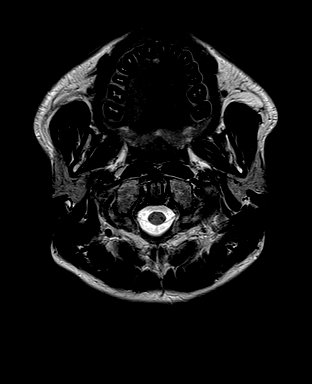

[Series 7: DWI · axial · 3.0mm · 1.36mm/px · z∈[-44,+115]mm · 5 of 108 slices shown (1 of 4)]
[im 1/108]
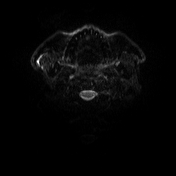
[im 27/108]
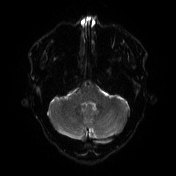
[im 54/108]
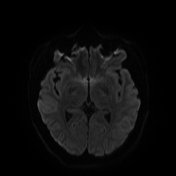
[im 81/108]
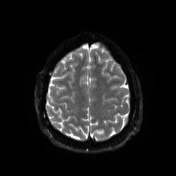
[im 108/108]
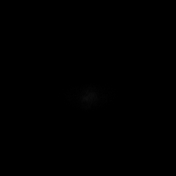

[Series 8: DWI · axial · 3.0mm · 1.36mm/px · z∈[-44,+115]mm · 2 of 54 slices shown (2 of 4)]
[im 1/54]
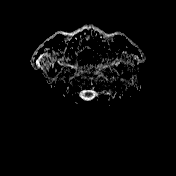
[im 54/54]
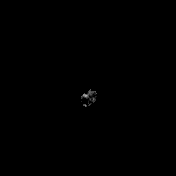

[Series 9: mip_images(sw) · axial · 24.0mm · 0.75mm/px · z∈[-44,+112]mm · 2 of 53 slices shown]
[im 1/53]
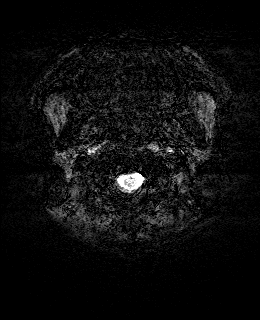
[im 53/53]
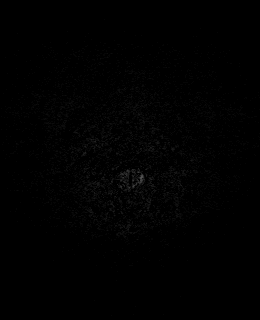

[Series 10: swi_images · axial · 3.0mm · 0.75mm/px · z∈[-54,+123]mm · 3 of 60 slices shown]
[im 1/60]
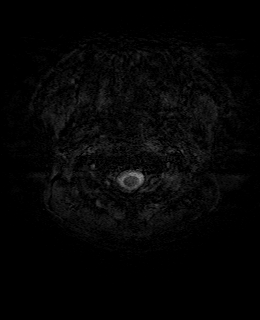
[im 30/60]
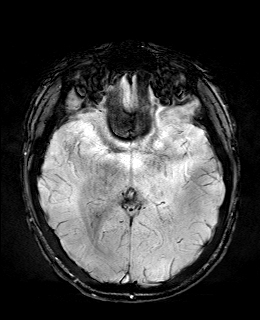
[im 60/60]
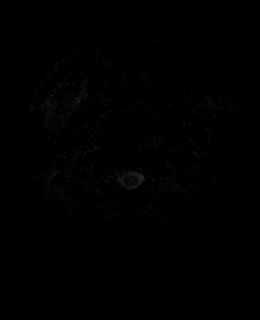

[Series 11: FLAIR · axial · 3.0mm · 0.75mm/px · z∈[-42,+111]mm · 2 of 52 slices shown]
[im 1/52]
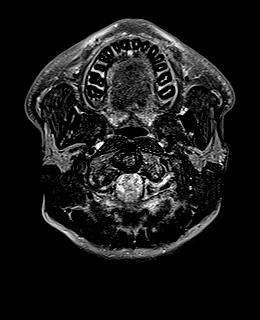
[im 52/52]
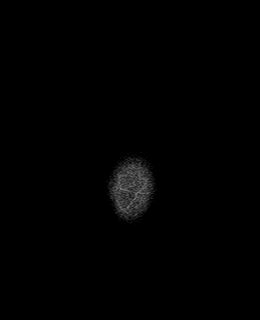

[Series 12: T1 · axial · 1.0mm · 0.94mm/px · z∈[-53,+122]mm · 8 of 176 slices shown (2 of 2)]
[im 1/176]
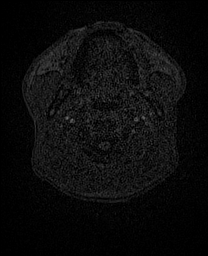
[im 26/176]
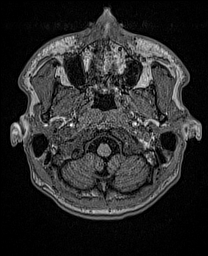
[im 51/176]
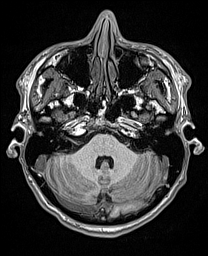
[im 76/176]
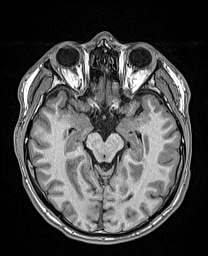
[im 101/176]
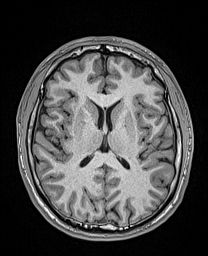
[im 126/176]
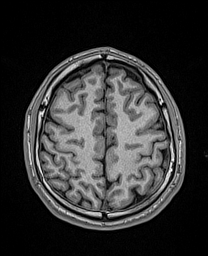
[im 151/176]
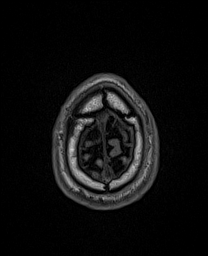
[im 176/176]
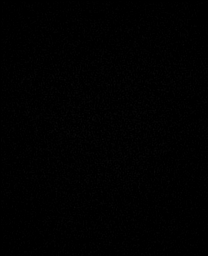

[Series 13: DWI · coronal · 5.0mm · 1.31mm/px · 3 of 72 slices shown (3 of 4)]
[im 1/72]
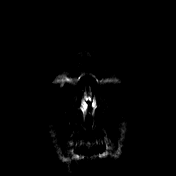
[im 36/72]
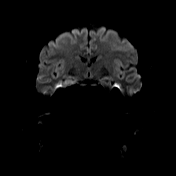
[im 72/72]
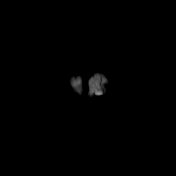

[Series 14: DWI · coronal · 5.0mm · 1.31mm/px · 2 of 36 slices shown (4 of 4)]
[im 1/36]
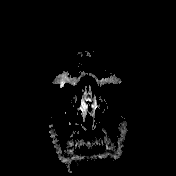
[im 36/36]
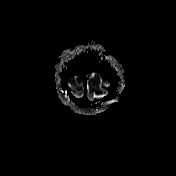

[Series 15: T2 post-contrast · coronal · 5.0mm · 0.57mm/px · 1 of 32 slices shown]
[im 1/32]
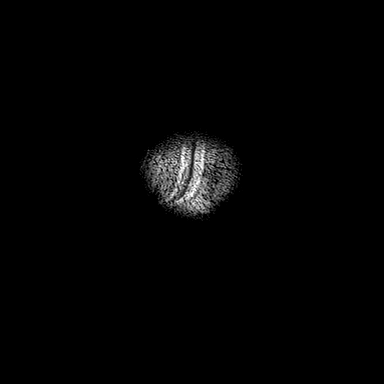

[Series 16: T1 post-contrast · axial · 1.0mm · 0.94mm/px · z∈[-45,+114]mm · 7 of 160 slices shown (1 of 2)]
[im 1/160]
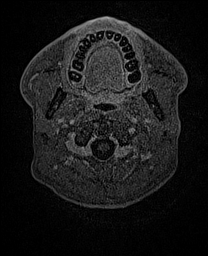
[im 27/160]
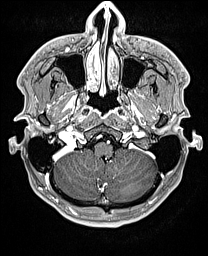
[im 54/160]
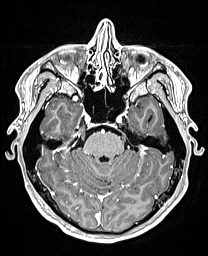
[im 80/160]
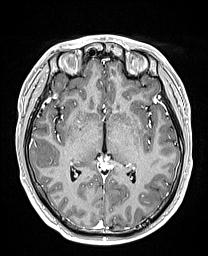
[im 107/160]
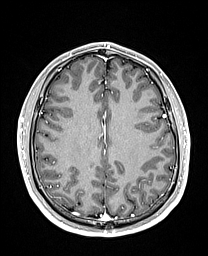
[im 133/160]
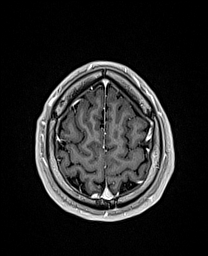
[im 160/160]
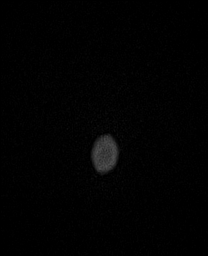

[Series 17: T1 post-contrast · coronal · 5.0mm · 0.43mm/px · 1 of 32 slices shown (2 of 2)]
[im 1/32]
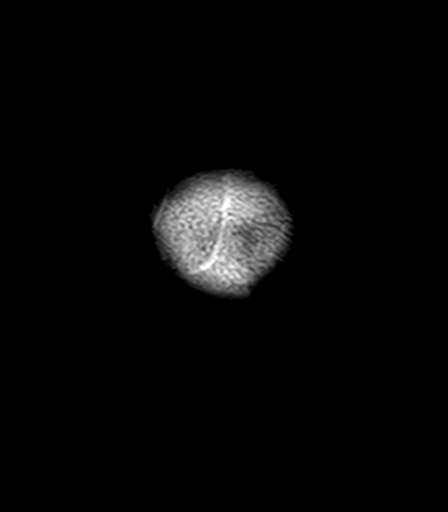

[Series 100: <mpr range> · axial · 1.0mm · 0.50mm/px · z∈[+61,+180]mm · 2 of 54 slices shown]
[im 1/54]
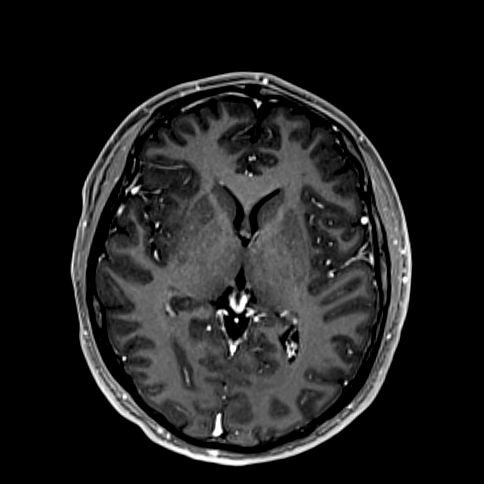
[im 54/54]
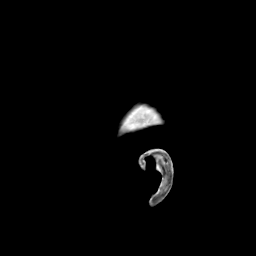

[41 of 48 positions shown; findings below may reference images not displayed]

FINDINGS: Brain: The brain has a normal appearance without evidence of
malformation, atrophy, old or acute small or large vessel
infarction, mass lesion, hemorrhage, hydrocephalus or extra-axial
collection. After contrast administration, no abnormal brain or
leptomeningeal enhancement occurs.

Vascular: Major vessels at the base of the brain show flow. Venous
sinuses appear patent.

Skull and upper cervical spine: Normal.

Sinuses/Orbits: Clear/normal.

Other: None significant.
IMPRESSION: Normal examination. No abnormality seen to explain the presenting
symptoms.
